# Patient Record
Sex: Female | Born: 1990 | Race: Black or African American | Hispanic: No | Marital: Single | Smoking: Never smoker
Health system: Southern US, Community
[De-identification: ages and names within clinical notes are randomized; demographics above are authoritative.]

## PROBLEM LIST (undated history)

## (undated) DIAGNOSIS — Z9289 Personal history of other medical treatment: Secondary | ICD-10-CM

## (undated) DIAGNOSIS — K589 Irritable bowel syndrome without diarrhea: Secondary | ICD-10-CM

## (undated) DIAGNOSIS — Z23 Encounter for immunization: Secondary | ICD-10-CM

## (undated) DIAGNOSIS — G932 Benign intracranial hypertension: Secondary | ICD-10-CM

## (undated) DIAGNOSIS — J45909 Unspecified asthma, uncomplicated: Secondary | ICD-10-CM

## (undated) HISTORY — DX: Irritable bowel syndrome, unspecified: K58.9

## (undated) HISTORY — DX: Unspecified asthma, uncomplicated: J45.909

## (undated) HISTORY — PX: WISDOM TOOTH EXTRACTION: SHX21

## (undated) HISTORY — DX: Encounter for immunization: Z23

## (undated) HISTORY — DX: Benign intracranial hypertension: G93.2

## (undated) HISTORY — DX: Personal history of other medical treatment: Z92.89

---
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
## 2015-07-22 DIAGNOSIS — Z9289 Personal history of other medical treatment: Secondary | ICD-10-CM

## 2015-07-22 HISTORY — DX: Personal history of other medical treatment: Z92.89

## 2015-07-22 LAB — HM PAP SMEAR: HM PAP: NEGATIVE

## 2016-08-12 ENCOUNTER — Other Ambulatory Visit: Payer: Self-pay | Admitting: Obstetrics and Gynecology

## 2016-08-12 NOTE — Telephone Encounter (Signed)
Pt calling to see why refill of medication was denied.  [phone]

## 2016-08-13 NOTE — Telephone Encounter (Signed)
Pt aware rx sent to [facility].

## 2016-08-13 NOTE — Telephone Encounter (Signed)
Pt is schedule 10/15/16 with Helmut MusterAlicia Copland for annual. Pt requestiing refill

## 2016-10-15 ENCOUNTER — Ambulatory Visit: Payer: Self-pay | Admitting: Obstetrics and Gynecology

## 2016-12-09 ENCOUNTER — Emergency Department (HOSPITAL_COMMUNITY)
Admission: EM | Admit: 2016-12-09 | Discharge: 2016-12-10 | Disposition: A | Discharge status: Home / Self Care | Payer: BLUE CROSS/BLUE SHIELD | Attending: Emergency Medicine | Admitting: Emergency Medicine

## 2016-12-09 ENCOUNTER — Emergency Department (HOSPITAL_COMMUNITY): Payer: BLUE CROSS/BLUE SHIELD

## 2016-12-09 ENCOUNTER — Encounter (HOSPITAL_COMMUNITY): Payer: Self-pay | Admitting: Emergency Medicine

## 2016-12-09 DIAGNOSIS — Z79899 Other long term (current) drug therapy: Secondary | ICD-10-CM | POA: Insufficient documentation

## 2016-12-09 DIAGNOSIS — H538 Other visual disturbances: Secondary | ICD-10-CM | POA: Diagnosis present

## 2016-12-09 DIAGNOSIS — G932 Benign intracranial hypertension: Secondary | ICD-10-CM | POA: Insufficient documentation

## 2016-12-09 DIAGNOSIS — J45909 Unspecified asthma, uncomplicated: Secondary | ICD-10-CM | POA: Diagnosis not present

## 2016-12-09 DIAGNOSIS — R51 Headache: Secondary | ICD-10-CM | POA: Diagnosis not present

## 2016-12-09 LAB — BASIC METABOLIC PANEL
Anion gap: 4 — ABNORMAL LOW (ref 5–15)
BUN: 8 mg/dL (ref 6–20)
CALCIUM: 9 mg/dL (ref 8.9–10.3)
CHLORIDE: 107 mmol/L (ref 101–111)
CO2: 25 mmol/L (ref 22–32)
CREATININE: 0.87 mg/dL (ref 0.44–1.00)
Glucose, Bld: 112 mg/dL — ABNORMAL HIGH (ref 65–99)
Potassium: 4.1 mmol/L (ref 3.5–5.1)
SODIUM: 136 mmol/L (ref 135–145)

## 2016-12-09 LAB — CBC WITH DIFFERENTIAL/PLATELET
Basophils Absolute: 0 10*3/uL (ref 0.0–0.1)
Basophils Relative: 0 %
EOS ABS: 0.3 10*3/uL (ref 0.0–0.7)
EOS PCT: 3 %
HCT: 36.8 % (ref 36.0–46.0)
Hemoglobin: 12.6 g/dL (ref 12.0–15.0)
LYMPHS ABS: 1.5 10*3/uL (ref 0.7–4.0)
Lymphocytes Relative: 18 %
MCH: 24.5 pg — ABNORMAL LOW (ref 26.0–34.0)
MCHC: 34.2 g/dL (ref 30.0–36.0)
MCV: 71.5 fL — AB (ref 78.0–100.0)
MONO ABS: 0.4 10*3/uL (ref 0.1–1.0)
Monocytes Relative: 5 %
Neutro Abs: 6.1 10*3/uL (ref 1.7–7.7)
Neutrophils Relative %: 74 %
PLATELETS: 267 10*3/uL (ref 150–400)
RBC: 5.15 MIL/uL — AB (ref 3.87–5.11)
RDW: 14.5 % (ref 11.5–15.5)
WBC: 8.3 10*3/uL (ref 4.0–10.5)

## 2016-12-09 LAB — I-STAT BETA HCG BLOOD, ED (MC, WL, AP ONLY)

## 2016-12-09 MED ORDER — SODIUM CHLORIDE 0.9 % IV BOLUS (SEPSIS)
1000.0000 mL | Freq: Once | INTRAVENOUS | Status: AC
  Administered 2016-12-10: 1000 mL via INTRAVENOUS

## 2016-12-09 MED ORDER — GADOBENATE DIMEGLUMINE 529 MG/ML IV SOLN
10.0000 mL | Freq: Once | INTRAVENOUS | Status: AC | PRN
  Administered 2016-12-09: 10 mL via INTRAVENOUS

## 2016-12-09 NOTE — ED Provider Notes (Signed)
Patient seen/examined in the Emergency Department in conjunction with Midlevel Provider  Patient reports HA and blurred vision.  Pt with probable pseudotumor Exam : awake/alert, appropriate, ?papilledema noted Plan: pt with negative MRI/MRV,  Pt with probable pseudotumor Plan for Lumbar puncture     Zadie RhineWickline, Davied Nocito, MD 12/09/16 2336

## 2016-12-09 NOTE — ED Notes (Signed)
Called pt to re-assess vitals, pt did not answer.  

## 2016-12-09 NOTE — ED Notes (Signed)
Called pt x2 to re-asses vitals, had no answer.

## 2016-12-09 NOTE — ED Triage Notes (Signed)
Pt reports blurred vision on and off for 1 year since starting birth control as well as a "whooshing" sound in her ears which she saw the ENT for and was told it was normal. Pt saw eye doctor today who said the blood vessels in her eyes were inflamed and sent pt here for further evaluation. Pt a/ox4 resp e/u, nad.

## 2016-12-09 NOTE — ED Notes (Signed)
Dr. Jacqulyn BathLong made aware of pts complaint and recommendation of [facility] Sher[facility]. Dr. Jacqulyn BathLong advised he would place appropriate orders

## 2016-12-10 LAB — GLUCOSE, CSF: GLUCOSE CSF: 59 mg/dL (ref 40–70)

## 2016-12-10 LAB — PROTEIN, CSF: Total  Protein, CSF: 15 mg/dL (ref 15–45)

## 2016-12-10 LAB — CSF CELL COUNT WITH DIFFERENTIAL
RBC Count, CSF: 1 /mm3 — ABNORMAL HIGH
RBC Count, CSF: 1 /mm3 — ABNORMAL HIGH
TUBE #: 4
Tube #: 1
WBC CSF: 1 /mm3 (ref 0–5)
WBC, CSF: 1 /mm3 (ref 0–5)

## 2016-12-10 MED ORDER — METOCLOPRAMIDE HCL 5 MG/ML IJ SOLN
10.0000 mg | Freq: Once | INTRAMUSCULAR | Status: AC
  Administered 2016-12-10: 10 mg via INTRAVENOUS
  Filled 2016-12-10: qty 2

## 2016-12-10 MED ORDER — LIDOCAINE HCL (PF) 1 % IJ SOLN
INTRAMUSCULAR | Status: AC
  Administered 2016-12-10: 30 mL via INTRADERMAL
  Filled 2016-12-10: qty 30

## 2016-12-10 MED ORDER — KETOROLAC TROMETHAMINE 30 MG/ML IJ SOLN
30.0000 mg | Freq: Once | INTRAMUSCULAR | Status: AC
  Administered 2016-12-10: 30 mg via INTRAVENOUS
  Filled 2016-12-10: qty 1

## 2016-12-10 MED ORDER — SODIUM CHLORIDE 0.9 % IV BOLUS (SEPSIS)
1000.0000 mL | Freq: Once | INTRAVENOUS | Status: AC
  Administered 2016-12-10: 1000 mL via INTRAVENOUS

## 2016-12-10 MED ORDER — LIDOCAINE HCL (PF) 1 % IJ SOLN
30.0000 mL | Freq: Once | INTRAMUSCULAR | Status: AC
  Administered 2016-12-10: 30 mL via INTRADERMAL
  Filled 2016-12-10: qty 30

## 2016-12-10 MED ORDER — DIPHENHYDRAMINE HCL 50 MG/ML IJ SOLN
12.5000 mg | Freq: Once | INTRAMUSCULAR | Status: AC
  Administered 2016-12-10: 12.5 mg via INTRAVENOUS
  Filled 2016-12-10: qty 1

## 2016-12-10 NOTE — ED Notes (Signed)
PA at bedside performing lumbar puncture .

## 2016-12-10 NOTE — ED Notes (Signed)
CSF specimen hand delivered to lab by RN .

## 2016-12-10 NOTE — ED Notes (Signed)
PA explained plan of care to pt. and family at bedside .

## 2016-12-10 NOTE — ED Provider Notes (Signed)
[facility]-EMERGENCY DEPT Provider Note   CSN: [id number] Arrival date & time: 12/09/16  1650     History   Chief Complaint Chief Complaint  Patient presents with  . Blurred Vision    HPI Emma Wood is a 26 y.o. female with a hx of asthma, IBS presents to the Emergency Department complaining of intermittent headaches and blurred vision onset 1 year ago.  Pt reports the 2 do not always occur together.  Pt reports vision becomes blurred with position change, worse in the AM. Symptoms last 1-2 minutes and then resolve.  Pt reports she has 2-3 generalized and throbbing headaches per week that resolve spontaneously or with ibuprofen. She denies aura, complex or neurologic symptoms.  No photophobia, phonophobia or vomiting.  No known aggravating factors for her headaches.  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 She was referred here for additional w/u.    Patient denies fever, chills, neck pain, chest pain, shortness of breath, abdominal pain, nausea, vomiting, diarrhea, weakness, dizziness, syncope.   The history is provided by the patient and medical records. No language interpreter was used.    Past Medical History:  Diagnosis Date  . Asthma   . History of Papanicolaou smear of cervix 07/22/2015   neg  . IBS (irritable bowel syndrome)   . Immunization, viral disease    gardasil completed    There are no active problems to display for this patient.   Past Surgical History:  Procedure Laterality Date  . WISDOM TOOTH EXTRACTION      OB History    Gravida Para Term Preterm AB Living   0 0 0 0 0 0   SAB TAB Ectopic Multiple Live Births   0 0 0 0 0       Home Medications    Prior to Admission medications   Medication Sig Start Date End Date Taking? Authorizing Provider  acetaminophen (TYLENOL) 325 MG tablet Take 325-650 mg by mouth every 6 (six) hours as needed (for pain or headaches).    Yes [provider]  ibuprofen (ADVIL,MOTRIN) 200 MG tablet Take 200-400 mg by mouth every 6 (six) hours as needed (for pain or headaches).    Yes [provider]  levocetirizine (XYZAL) 5 MG tablet Take 5 mg by mouth daily.   Yes [provider]  LO LOESTRIN FE 1 MG-10 MCG / 10 MCG tablet TAKE 1 TABLET BY MOUTH EVERY DAY 08/13/16  Yes Copland, Ilona Sorrel, PA-C    Family History Family History  Problem Relation Age of Onset  . Hypertension Mother   . Hypertension Father   . Diabetes Maternal Aunt   . Diabetes Maternal Grandmother     Social History Social History  Substance Use Topics  . Smoking status: Never Smoker  . Smokeless tobacco: Never Used  . Alcohol use No     Allergies   Cherry   Review of Systems Review of Systems  Constitutional: Negative for appetite change, diaphoresis, fatigue, fever and unexpected weight change.  HENT: Negative for mouth sores.   Eyes: Positive for visual disturbance ( intermittent blurred vision).  Respiratory: Negative for cough, chest tightness, shortness of breath and wheezing.   Cardiovascular: Negative for chest pain.  Gastrointestinal: Negative for abdominal pain, constipation, diarrhea, nausea and vomiting.  Endocrine: Negative for polydipsia, polyphagia and polyuria.  Genitourinary: Negative for dysuria, frequency, hematuria and urgency.  Musculoskeletal: Negative for back pain and  neck stiffness.  Skin: Negative for rash.  Allergic/Immunologic: Negative for immunocompromised state.  Neurological: Positive for headaches (intermittent, not present currently). Negative for syncope and light-headedness.  Hematological: Does not bruise/bleed easily.  Psychiatric/Behavioral: Negative for sleep disturbance. The patient is not nervous/anxious.   All other systems reviewed and are negative.    Physical Exam Updated Vital Signs BP (!) 146/90   Pulse (!) 104   Temp 98.5 F (36.9 C) (Oral)   Resp 14   LMP 12/09/2016  (Exact Date)   SpO2 100%   Physical Exam  Constitutional: She is oriented to person, place, and time. She appears well-developed and well-nourished. No distress.  HENT:  Head: Normocephalic and atraumatic.  Mouth/Throat: Oropharynx is clear and moist.  Eyes: Pupils are equal, round, and reactive to light. Conjunctivae and EOM are normal. No scleral icterus.  No horizontal, vertical or rotational nystagmus  Neck: Normal range of motion. Neck supple.  Full active and passive ROM without pain No midline or paraspinal tenderness No nuchal rigidity or meningeal signs  Cardiovascular: Normal rate, regular rhythm and intact distal pulses.   Pulmonary/Chest: Effort normal and breath sounds normal. No respiratory distress. She has no wheezes. She has no rales.  Abdominal: Soft. Bowel sounds are normal. There is no tenderness. There is no rebound and no guarding.  Musculoskeletal: Normal range of motion.  Lymphadenopathy:    She has no cervical adenopathy.  Neurological: She is alert and oriented to person, place, and time. No cranial nerve deficit. She exhibits normal muscle tone. Coordination normal.  Mental Status:  Alert, oriented, thought content appropriate. Speech fluent without evidence of aphasia. Able to follow 2 step commands without difficulty.  Cranial Nerves:  II:  Peripheral visual fields grossly normal, pupils equal, round, reactive to light III,IV, VI: ptosis not present, extra-ocular motions intact bilaterally  V,VII: smile symmetric, facial light touch sensation equal VIII: hearing grossly normal bilaterally  IX,X: midline uvula rise  XI: bilateral shoulder shrug equal and strong XII: midline tongue extension  Motor:  5/5 in upper and lower extremities bilaterally including strong and equal grip strength and dorsiflexion/plantar flexion Sensory: Pinprick and light touch normal in all extremities.  Cerebellar: normal finger-to-nose with bilateral upper extremities Gait:  normal gait and balance CV: distal pulses palpable throughout   Skin: Skin is warm and dry. No rash noted. She is not diaphoretic.  Psychiatric: She has a normal mood and affect. Her behavior is normal. Judgment and thought content normal.  Nursing note and vitals reviewed.    ED Treatments / Results  Labs (all labs ordered are listed, but only abnormal results are displayed) Labs Reviewed  BASIC METABOLIC PANEL - Abnormal; Notable for the following:       Result Value   Glucose, Bld 112 (*)    Anion gap 4 (*)    All other components within normal limits  CBC WITH DIFFERENTIAL/PLATELET - Abnormal; Notable for the following:    RBC 5.15 (*)    MCV 71.5 (*)    MCH 24.5 (*)    All other components within normal limits  CSF CELL COUNT WITH DIFFERENTIAL - Abnormal; Notable for the following:    RBC Count, CSF 1 (*)    All other components within normal limits  CSF CELL COUNT WITH DIFFERENTIAL - Abnormal; Notable for the following:    RBC Count, CSF 1 (*)    All other components within normal limits  CSF CULTURE  GLUCOSE, CSF  PROTEIN, CSF  I-STAT  BETA HCG BLOOD, ED ([facility], [facility], [facility] ONLY)    Radiology Mr Brain Wo Contrast  Result Date: 12/09/2016 CLINICAL DATA:  Initial evaluation for intermittent blurry vision for 1 year, visual loss. EXAM: MRI HEAD WITHOUT CONTRAST MRI ORBITS WITH AND WITHOUT CONTRAST MRV HEAD WITHOUT CONTRAST TECHNIQUE: Multiplanar, multiecho pulse sequences of the brain and surrounding structures were obtained without intravenous contrast. Multiplanar, multiecho pulse sequences of the orbits and surrounding structures were obtained including fat saturation techniques, without and with intravenous contrast administration. MRV images of the brain are also obtained. COMPARISON:  Prior CT from 05/30/2010. FINDINGS: MRI HEAD FINDINGS Brain: Cerebral volume within normal limits for age. No focal parenchymal signal abnormality. No cerebral white matter changes. No abnormal  foci of restricted diffusion to suggest acute or subacute ischemia. Gray-white matter differentiation well maintained. No encephalomalacia to suggest chronic infarction. No evidence for acute or chronic intracranial hemorrhage. No mass lesion, midline shift or mass effect. Ventricles normal in size without evidence for hydrocephalus. No extra-axial fluid collection. Vascular: Major intracranial vascular flow voids are well maintained and normal in appearance. Skull and upper cervical spine: Craniocervical junction normal. Upper cervical spine within normal limits. Bone marrow signal intensity normal. No scalp soft tissue abnormality. Other: Inner ear structures normal.  No mastoid effusion. MRI ORBITS FINDINGS Orbits: Mild enlargement of the optic discs bilaterally, best seen on axial T2 weighted sequence (series 6, image 9). Optic nerves are otherwise unremarkable and are symmetric in size with normal morphology. No abnormal enhancement to suggest inflammation. No abnormality seen at the orbital apices. Optic chiasm normal. Extra-ocular muscles symmetric and normal in appearance bilaterally. Superior orbital veins within normal limits. No abnormality seen at the cavernous sinus. Lacrimal glands normal. Intraconal and extraconal fat well-maintained and normal. Visualized sinuses: Mild mucosal thickening within the ethmoidal air cells. Visualized sinuses are otherwise clear. Soft tissues: Periorbital soft tissues within normal limits. MRV HEAD FINDINGS The superior sagittal sinus is widely patent without abnormality. Torcula patent. Right transverse and sigmoid sinuses are dominant and widely patent without abnormality. Proximal right internal jugular vein is patent. Left transverse and sigmoid sinuses diminutive but patent. Proximal left internal jugular vein patent. Straight sinus, vein of Galen, and internal cerebral veins are patent bilaterally. IMPRESSION: 1. Mild enlargement of the optic discs, with no other  corroborative findings to suggest elevated intracranial pressures within the brain. Finding is of uncertain significance. Correlation with fundoscopic exam recommended. 2. Otherwise normal MRI of the brain and orbits. 3. Normal MRV. Electronically Signed   By: Rise Mu M.D.   On: 12/09/2016 21:28   Mr Mrv Head Wo Cm  Result Date: 12/09/2016 CLINICAL DATA:  Initial evaluation for intermittent blurry vision for 1 year, visual loss. EXAM: MRI HEAD WITHOUT CONTRAST MRI ORBITS WITH AND WITHOUT CONTRAST MRV HEAD WITHOUT CONTRAST TECHNIQUE: Multiplanar, multiecho pulse sequences of the brain and surrounding structures were obtained without intravenous contrast. Multiplanar, multiecho pulse sequences of the orbits and surrounding structures were obtained including fat saturation techniques, without and with intravenous contrast administration. MRV images of the brain are also obtained. COMPARISON:  Prior CT from 05/30/2010. FINDINGS: MRI HEAD FINDINGS Brain: Cerebral volume within normal limits for age. No focal parenchymal signal abnormality. No cerebral white matter changes. No abnormal foci of restricted diffusion to suggest acute or subacute ischemia. Gray-white matter differentiation well maintained. No encephalomalacia to suggest chronic infarction. No evidence for acute or chronic intracranial hemorrhage. No mass lesion, midline shift or mass effect. Ventricles normal in size  without evidence for hydrocephalus. No extra-axial fluid collection. Vascular: Major intracranial vascular flow voids are well maintained and normal in appearance. Skull and upper cervical spine: Craniocervical junction normal. Upper cervical spine within normal limits. Bone marrow signal intensity normal. No scalp soft tissue abnormality. Other: Inner ear structures normal.  No mastoid effusion. MRI ORBITS FINDINGS Orbits: Mild enlargement of the optic discs bilaterally, best seen on axial T2 weighted sequence (series 6, image  9). Optic nerves are otherwise unremarkable and are symmetric in size with normal morphology. No abnormal enhancement to suggest inflammation. No abnormality seen at the orbital apices. Optic chiasm normal. Extra-ocular muscles symmetric and normal in appearance bilaterally. Superior orbital veins within normal limits. No abnormality seen at the cavernous sinus. Lacrimal glands normal. Intraconal and extraconal fat well-maintained and normal. Visualized sinuses: Mild mucosal thickening within the ethmoidal air cells. Visualized sinuses are otherwise clear. Soft tissues: Periorbital soft tissues within normal limits. MRV HEAD FINDINGS The superior sagittal sinus is widely patent without abnormality. Torcula patent. Right transverse and sigmoid sinuses are dominant and widely patent without abnormality. Proximal right internal jugular vein is patent. Left transverse and sigmoid sinuses diminutive but patent. Proximal left internal jugular vein patent. Straight sinus, vein of Galen, and internal cerebral veins are patent bilaterally. IMPRESSION: 1. Mild enlargement of the optic discs, with no other corroborative findings to suggest elevated intracranial pressures within the brain. Finding is of uncertain significance. Correlation with fundoscopic exam recommended. 2. Otherwise normal MRI of the brain and orbits. 3. Normal MRV. Electronically Signed   By: Rise MuBenjamin  McClintock M.D.   On: 12/09/2016 21:28   Mr Rockwell GermanyOrbits W ZOWo Contrast  Result Date: 12/09/2016 CLINICAL DATA:  Initial evaluation for intermittent blurry vision for 1 year, visual loss. EXAM: MRI HEAD WITHOUT CONTRAST MRI ORBITS WITH AND WITHOUT CONTRAST MRV HEAD WITHOUT CONTRAST TECHNIQUE: Multiplanar, multiecho pulse sequences of the brain and surrounding structures were obtained without intravenous contrast. Multiplanar, multiecho pulse sequences of the orbits and surrounding structures were obtained including fat saturation techniques, without and with  intravenous contrast administration. MRV images of the brain are also obtained. COMPARISON:  Prior CT from 05/30/2010. FINDINGS: MRI HEAD FINDINGS Brain: Cerebral volume within normal limits for age. No focal parenchymal signal abnormality. No cerebral white matter changes. No abnormal foci of restricted diffusion to suggest acute or subacute ischemia. Gray-white matter differentiation well maintained. No encephalomalacia to suggest chronic infarction. No evidence for acute or chronic intracranial hemorrhage. No mass lesion, midline shift or mass effect. Ventricles normal in size without evidence for hydrocephalus. No extra-axial fluid collection. Vascular: Major intracranial vascular flow voids are well maintained and normal in appearance. Skull and upper cervical spine: Craniocervical junction normal. Upper cervical spine within normal limits. Bone marrow signal intensity normal. No scalp soft tissue abnormality. Other: Inner ear structures normal.  No mastoid effusion. MRI ORBITS FINDINGS Orbits: Mild enlargement of the optic discs bilaterally, best seen on axial T2 weighted sequence (series 6, image 9). Optic nerves are otherwise unremarkable and are symmetric in size with normal morphology. No abnormal enhancement to suggest inflammation. No abnormality seen at the orbital apices. Optic chiasm normal. Extra-ocular muscles symmetric and normal in appearance bilaterally. Superior orbital veins within normal limits. No abnormality seen at the cavernous sinus. Lacrimal glands normal. Intraconal and extraconal fat well-maintained and normal. Visualized sinuses: Mild mucosal thickening within the ethmoidal air cells. Visualized sinuses are otherwise clear. Soft tissues: Periorbital soft tissues within normal limits. MRV HEAD FINDINGS The superior sagittal sinus  is widely patent without abnormality. Torcula patent. Right transverse and sigmoid sinuses are dominant and widely patent without abnormality. Proximal right  internal jugular vein is patent. Left transverse and sigmoid sinuses diminutive but patent. Proximal left internal jugular vein patent. Straight sinus, vein of Galen, and internal cerebral veins are patent bilaterally. IMPRESSION: 1. Mild enlargement of the optic discs, with no other corroborative findings to suggest elevated intracranial pressures within the brain. Finding is of uncertain significance. Correlation with fundoscopic exam recommended. 2. Otherwise normal MRI of the brain and orbits. 3. Normal MRV. Electronically Signed   By: Rise Mu M.D.   On: 12/09/2016 21:28    Procedures .Lumbar Puncture Date/Time: 12/10/2016 5:01 AM Performed by: Dierdre Forth Authorized by: Dierdre Forth   Consent:    Consent obtained:  Verbal and written   Consent given by:  Patient   Risks discussed:  Bleeding, infection, pain, headache, nerve damage and repeat procedure   Alternatives discussed:  Referral Universal protocol:    Procedure explained and questions answered to patient or proxy's satisfaction: yes     Relevant documents present and verified: yes     Test results available and properly labeled: yes     Imaging studies available: yes     Required blood products, implants, devices, and special equipment available: yes     Immediately prior to procedure a time out was called: yes     Site/side marked: yes     Patient identity confirmed:  Verbally with patient and arm band Pre-procedure details:    Procedure purpose:  Diagnostic   Preparation: Patient was prepped and draped in usual sterile fashion   Anesthesia (see MAR for exact dosages):    Anesthesia method:  Local infiltration   Local anesthetic:  Lidocaine 1% w/o epi (5mL) Procedure details:    Lumbar space:  L3-L4 interspace   Patient position:  Sitting   Needle gauge:  20   Needle type:  Spinal needle - Quincke tip   Needle length (in):  1.5   Ultrasound guidance: no     Number of attempts:  4   Fluid  appearance:  Clear   Tubes of fluid:  4   Total volume (ml):  12 Post-procedure:    Puncture site:  Direct pressure applied and adhesive bandage applied   Patient tolerance of procedure:  Tolerated well, no immediate complications Comments:     Several attempts in the Left lateral position without success.  Pt was then sat up and after several attempts, LP was successful with clear fluid and steady stream.  Unable to obtain opening pressure due to required position change.     (including critical care time)  Medications Ordered in ED Medications  gadobenate dimeglumine (MULTIHANCE) injection 10 mL (10 mLs Intravenous Contrast Given 12/09/16 2041)  sodium chloride 0.9 % bolus 1,000 mL (0 mLs Intravenous Stopped 12/10/16 0131)  lidocaine (PF) (XYLOCAINE) 1 % injection 30 mL (30 mLs Intradermal Given 12/10/16 0108)  ketorolac (TORADOL) 30 MG/ML injection 30 mg (30 mg Intravenous Given 12/10/16 0528)  metoCLOPramide (REGLAN) injection 10 mg (10 mg Intravenous Given 12/10/16 0528)  diphenhydrAMINE (BENADRYL) injection 12.5 mg (12.5 mg Intravenous Given 12/10/16 0528)  sodium chloride 0.9 % bolus 1,000 mL (0 mLs Intravenous Stopped 12/10/16 0648)     Initial Impression / Assessment and Plan / ED Course  I have reviewed the triage vital signs and the nursing notes.  Pertinent labs & imaging results that were available during my care of the  patient were reviewed by me and considered in my medical decision making (see chart for details).     Patient presents with headache and vision changes. MRI/MRV negative for tumor, sinus thrombosis. She does have mild enlargement of the optic disks without other findings to suggest elevated intracranial pressures. Suspect pseudotumor cerebri.  Lumbar puncture was unable to be performed left lateral therefore no opening pressure was obtained however CSF was clear and cell count with differential, glucose and protein were within normal limits. No clinical evidence  for meningitis.  Patient will be discharged home with neurology follow-up and strict return precautions.    Final Clinical Impressions(s) / ED Diagnoses   Final diagnoses:  Pseudotumor cerebri    New Prescriptions Discharge Medication List as of 12/10/2016  7:03 AM       Timoteo Carreiro, Dahlia Client, PA-C 12/10/16 2536    Zadie Rhine, MD 12/10/16 [phone]

## 2016-12-10 NOTE — ED Notes (Addendum)
MD at bedside doing Lumbar Puncture

## 2016-12-13 LAB — CSF CULTURE W GRAM STAIN: Culture: NO GROWTH

## 2016-12-13 LAB — CSF CULTURE

## 2017-01-05 ENCOUNTER — Other Ambulatory Visit: Payer: Self-pay | Admitting: Acute Care

## 2017-01-05 DIAGNOSIS — G932 Benign intracranial hypertension: Secondary | ICD-10-CM

## 2017-01-13 ENCOUNTER — Ambulatory Visit
Admission: RE | Admit: 2017-01-13 | Discharge: 2017-01-13 | Disposition: A | Discharge status: Home / Self Care | Payer: BLUE CROSS/BLUE SHIELD | Source: Ambulatory Visit | Attending: Acute Care | Admitting: Acute Care

## 2017-01-13 DIAGNOSIS — G932 Benign intracranial hypertension: Secondary | ICD-10-CM

## 2017-01-13 LAB — PREGNANCY, URINE: PREG TEST UR: NEGATIVE

## 2017-01-13 MED ORDER — LIDOCAINE HCL (PF) 1 % IJ SOLN
5.0000 mL | Freq: Once | INTRAMUSCULAR | Status: AC
  Administered 2017-01-13: 5 mL
  Filled 2017-01-13: qty 5

## 2017-01-13 MED ORDER — ACETAMINOPHEN 500 MG PO TABS
1000.0000 mg | ORAL_TABLET | Freq: Four times a day (QID) | ORAL | Status: DC | PRN
  Administered 2017-01-13: 1000 mg via ORAL
  Filled 2017-01-13 (×2): qty 2

## 2017-01-13 MED ORDER — ACETAMINOPHEN 500 MG PO TABS
ORAL_TABLET | ORAL | Status: AC
Start: 2017-01-13 — End: 2017-01-13
  Filled 2017-01-13: qty 2

## 2017-01-13 NOTE — Progress Notes (Signed)
Spoke with Dr. Allena KatzPatel,. May discharge patient after 2 hours post LP if patient is non-symptomatic.

## 2019-02-25 LAB — HM PAP SMEAR: HM Pap smear: NEGATIVE

## 2019-03-08 ENCOUNTER — Other Ambulatory Visit: Payer: Self-pay

## 2019-03-08 ENCOUNTER — Inpatient Hospital Stay: Payer: Federal, State, Local not specified - PPO | Attending: Oncology | Admitting: Oncology

## 2019-03-08 ENCOUNTER — Inpatient Hospital Stay: Payer: Federal, State, Local not specified - PPO

## 2019-03-08 ENCOUNTER — Encounter: Payer: Self-pay | Admitting: Oncology

## 2019-03-08 VITALS — BP 113/80 | HR 89 | Temp 97.3°F | Resp 18 | Ht 62.0 in | Wt 155.3 lb

## 2019-03-08 DIAGNOSIS — J45909 Unspecified asthma, uncomplicated: Secondary | ICD-10-CM | POA: Insufficient documentation

## 2019-03-08 DIAGNOSIS — Z79899 Other long term (current) drug therapy: Secondary | ICD-10-CM | POA: Insufficient documentation

## 2019-03-08 DIAGNOSIS — G932 Benign intracranial hypertension: Secondary | ICD-10-CM | POA: Insufficient documentation

## 2019-03-08 DIAGNOSIS — Z832 Family history of diseases of the blood and blood-forming organs and certain disorders involving the immune mechanism: Secondary | ICD-10-CM | POA: Diagnosis not present

## 2019-03-08 DIAGNOSIS — R718 Other abnormality of red blood cells: Secondary | ICD-10-CM | POA: Diagnosis not present

## 2019-03-08 DIAGNOSIS — K589 Irritable bowel syndrome without diarrhea: Secondary | ICD-10-CM | POA: Insufficient documentation

## 2019-03-08 DIAGNOSIS — D582 Other hemoglobinopathies: Secondary | ICD-10-CM | POA: Diagnosis not present

## 2019-03-08 DIAGNOSIS — D509 Iron deficiency anemia, unspecified: Secondary | ICD-10-CM

## 2019-03-08 LAB — CBC WITH DIFFERENTIAL/PLATELET
Abs Immature Granulocytes: 0.05 10*3/uL (ref 0.00–0.07)
Basophils Absolute: 0.1 10*3/uL (ref 0.0–0.1)
Basophils Relative: 1 %
Eosinophils Absolute: 0.2 10*3/uL (ref 0.0–0.5)
Eosinophils Relative: 2 %
HCT: 36.6 % (ref 36.0–46.0)
Hemoglobin: 12 g/dL (ref 12.0–15.0)
Immature Granulocytes: 1 %
Lymphocytes Relative: 21 %
Lymphs Abs: 1.8 10*3/uL (ref 0.7–4.0)
MCH: 24 pg — ABNORMAL LOW (ref 26.0–34.0)
MCHC: 32.8 g/dL (ref 30.0–36.0)
MCV: 73.3 fL — ABNORMAL LOW (ref 80.0–100.0)
Monocytes Absolute: 0.5 10*3/uL (ref 0.1–1.0)
Monocytes Relative: 5 %
Neutro Abs: 6.2 10*3/uL (ref 1.7–7.7)
Neutrophils Relative %: 70 %
Platelets: 271 10*3/uL (ref 150–400)
RBC: 4.99 MIL/uL (ref 3.87–5.11)
RDW: 15.5 % (ref 11.5–15.5)
WBC: 8.9 10*3/uL (ref 4.0–10.5)
nRBC: 0 % (ref 0.0–0.2)

## 2019-03-08 LAB — IRON AND TIBC
Iron: 57 ug/dL (ref 28–170)
Saturation Ratios: 21 % (ref 10.4–31.8)
TIBC: 277 ug/dL (ref 250–450)
UIBC: 220 ug/dL

## 2019-03-08 LAB — FERRITIN: Ferritin: 11 ng/mL (ref 11–307)

## 2019-03-08 NOTE — Progress Notes (Signed)
Patient here for initial visit. Referred by Dr. Leonides Schanz for iron deficiency anemia. Denies having heavy menstrual periods.

## 2019-03-09 DIAGNOSIS — R718 Other abnormality of red blood cells: Secondary | ICD-10-CM | POA: Insufficient documentation

## 2019-03-09 LAB — TECHNOLOGIST SMEAR REVIEW
RBC Morphology: NORMAL
Tech Review: ADEQUATE

## 2019-03-09 NOTE — Progress Notes (Signed)
Hematology/Oncology Consult note [facility] [phone] [phone]   Patient Care Team: System, Pcp Not In as PCP - General  REFERRING PROVIDER: Ward, Honor Loh, MD  CHIEF COMPLAINTS/REASON FOR VISIT:  Evaluation of iron deficiency anmeia  HISTORY OF PRESENTING ILLNESS:   Emma Wood is a  28 y.o.  female with PMH listed below was seen in consultation at the request of  Ward, Honor Loh, MD  for evaluation of iron deficiency anemia.  Patient was seen by Dr.Ward recently   02/16/2019 CBC showed hemoglobin 12.5, MCV 74.2, platelet 219,000. Wbc 8.7. Iron panel showed transferrin 209.8, iron 127, iron saturation 43, ferritin 12.   Reviewed her previous blood work.  11/23/2017 showed MCV 71.6, hemoglobin 12.5  She reports her mother also has " smaller blood cells", mild anemia.  Family history is positive for sickle cell trait.  Denies fatigue, fever chills, unintentional weight loss,  Flow of menses, moderate.   # History of pseudotumor cerebri.    Review of Systems  Constitutional: Negative for appetite change, chills, fatigue and fever.  HENT:   Negative for hearing loss and voice change.   Eyes: Negative for eye problems.  Respiratory: Negative for chest tightness and cough.   Cardiovascular: Negative for chest pain.  Gastrointestinal: Negative for abdominal distention, abdominal pain and blood in stool.  Endocrine: Negative for hot flashes.  Genitourinary: Negative for difficulty urinating and frequency.   Musculoskeletal: Negative for arthralgias.  Skin: Negative for itching and rash.  Neurological: Negative for extremity weakness.  Hematological: Negative for adenopathy.  Psychiatric/Behavioral: Negative for confusion.    MEDICAL HISTORY:  Past Medical History:  Diagnosis Date  . Asthma   . History of Papanicolaou smear of cervix 07/22/2015   neg  . IBS (irritable bowel syndrome)   . Immunization, viral disease    gardasil completed  . Pseudotumor     SURGICAL HISTORY: Past Surgical History:  Procedure Laterality Date  . WISDOM TOOTH EXTRACTION      SOCIAL HISTORY: Social History   Socioeconomic History  . Marital status: Single    Spouse name: Not on file  . Number of children: 0  . Years of education: 93  . Highest education level: Not on file  Occupational History  . Occupation: customer service    Comment: [place]  Tobacco Use  . Smoking status: Never Smoker  . Smokeless tobacco: Never Used  Substance and Sexual Activity  . Alcohol use: No  . Drug use: No  . Sexual activity: Yes    Birth control/protection: Condom  Other Topics Concern  . Not on file  Social History Narrative  . Not on file   Social Determinants of Health   Financial Resource Strain:   . Difficulty of Paying Living Expenses: Not on file  Food Insecurity:   . Worried About Charity fundraiser in the Last Year: Not on file  . Ran Out of Food in the Last Year: Not on file  Transportation Needs:   . Lack of Transportation (Medical): Not on file  . Lack of Transportation (Non-Medical): Not on file  Physical Activity:   . Days of Exercise per Week: Not on file  . Minutes of Exercise per Session: Not on file  Stress:   . Feeling of Stress : Not on file  Social Connections:   . Frequency of Communication with Friends and Family: Not on file  . Frequency of Social Gatherings with Friends and Family: Not on file  .  Attends Religious Services: Not on file  . Active Member of Clubs or Organizations: Not on file  . Attends BankerClub or Organization Meetings: Not on file  . Marital Status: Not on file  Intimate Partner Violence:   . Fear of Current or Ex-Partner: Not on file  . Emotionally Abused: Not on file  . Physically Abused: Not on file  . Sexually Abused: Not on file    FAMILY HISTORY: Family History  Problem Relation Age of Onset  . Hypertension Mother   . Hypertension Father   . Diabetes Maternal  Aunt   . Diabetes Maternal Grandmother     ALLERGIES:  is allergic to cherry.  MEDICATIONS:  Current Outpatient Medications  Medication Sig Dispense Refill  . acetaminophen (TYLENOL) 325 MG tablet Take 325-650 mg by mouth every 6 (six) hours as needed (for pain or headaches).     Marland Kitchen. acetaZOLAMIDE (DIAMOX) 500 MG capsule Take by mouth.    Marland Kitchen. ibuprofen (ADVIL,MOTRIN) 200 MG tablet Take 200-400 mg by mouth every 6 (six) hours as needed (for pain or headaches).     Marland Kitchen. levocetirizine (XYZAL) 5 MG tablet Take 5 mg by mouth daily.    . Vitamin D, Ergocalciferol, (DRISDOL) 1.25 MG (50000 UT) CAPS capsule Take by mouth.    . LO LOESTRIN FE 1 MG-10 MCG / 10 MCG tablet TAKE 1 TABLET BY MOUTH EVERY DAY (Patient not taking: Reported on 03/08/2019) 84 tablet 0   No current facility-administered medications for this visit.     PHYSICAL EXAMINATION: ECOG PERFORMANCE STATUS: 0 - Asymptomatic Vitals:   03/08/19 1516  BP: 113/80  Pulse: 89  Resp: 18  Temp: (!) 97.3 F (36.3 C)   Filed Weights   03/08/19 1516  Weight: 155 lb 4.8 oz (70.4 kg)    Physical Exam Constitutional:      General: She is not in acute distress. HENT:     Head: Normocephalic and atraumatic.  Eyes:     General: No scleral icterus.    Pupils: Pupils are equal, round, and reactive to light.  Cardiovascular:     Rate and Rhythm: Normal rate and regular rhythm.     Heart sounds: Normal heart sounds.  Pulmonary:     Effort: Pulmonary effort is normal. No respiratory distress.     Breath sounds: No wheezing.  Abdominal:     General: Bowel sounds are normal. There is no distension.     Palpations: Abdomen is soft. There is no mass.     Tenderness: There is no abdominal tenderness.  Musculoskeletal:        General: No deformity. Normal range of motion.     Cervical back: Normal range of motion and neck supple.  Skin:    General: Skin is warm and dry.     Findings: No erythema or rash.  Neurological:     Mental Status:  She is alert and oriented to person, place, and time.     Cranial Nerves: No cranial nerve deficit.     Coordination: Coordination normal.  Psychiatric:        Behavior: Behavior normal.        Thought Content: Thought content normal.     LABORATORY DATA:  I have reviewed the data as listed Lab Results  Component Value Date   WBC 8.9 03/08/2019   HGB 12.0 03/08/2019   HCT 36.6 03/08/2019   MCV 73.3 (L) 03/08/2019   PLT 271 03/08/2019   No results for input(s): NA, K,  CL, CO2, GLUCOSE, BUN, CREATININE, CALCIUM, GFRNONAA, GFRAA, PROT, ALBUMIN, AST, ALT, ALKPHOS, BILITOT, BILIDIR, IBILI in the last 8760 hours. Iron/TIBC/Ferritin/ %Sat    Component Value Date/Time   IRON 57 03/08/2019 1549   TIBC 277 03/08/2019 1549   FERRITIN 11 03/08/2019 1549   IRONPCTSAT 21 03/08/2019 1549      RADIOGRAPHIC STUDIES: I have personally reviewed the radiological images as listed and agreed with the findings in the report. No results found.    ASSESSMENT & PLAN:  1. Microcytosis   2. Family history of sickle cell trait   3. Pseudotumor cerebri    Labs are reviewed and discussed with patient.  She has persistent microcytosis without anemia.  Recent iron panel showed a mixed picture. Low normal end ferritin 12, normal iron saturation 43%. TIBC is at low normal end.  Suspect underlying hemoglobinopathy.Family history of sickle cell trait.  Will send work up . Repeat iron panel, check cbc, smear.    pseudotumor cerebri. On Diamox. Follow up with neurology Orders Placed This Encounter  Procedures  . Hemoglobinopathy evaluation    Standing Status:   Future    Number of Occurrences:   1    Standing Expiration Date:   03/07/2020  . CBC with Differential    Standing Status:   Future    Number of Occurrences:   1    Standing Expiration Date:   03/07/2020  . Ferritin    Standing Status:   Future    Number of Occurrences:   1    Standing Expiration Date:   03/07/2020  . Iron and TIBC     Standing Status:   Future    Number of Occurrences:   1    Standing Expiration Date:   03/07/2020  . Technologist smear review    Standing Status:   Future    Number of Occurrences:   1    Standing Expiration Date:   03/07/2020    All questions were answered. The patient knows to call the clinic with any problems questions or concerns.  cc Ward, Elenora Fender, MD   Return of visit: 2 weeks virtual visit to discuss blood work.  Thank you for this kind referral and the opportunity to participate in the care of this patient. A copy of today's note is routed to referring provider    Rickard Patience, MD, PhD Hematology Oncology [facility] Pager- [phone] 03/09/2019

## 2019-03-10 LAB — HEMOGLOBINOPATHY EVALUATION
Hgb A2 Quant: 3.1 % (ref 1.8–3.2)
Hgb A: 65.2 % — ABNORMAL LOW (ref 96.4–98.8)
Hgb C: 31.7 % — ABNORMAL HIGH
Hgb F Quant: 0 % (ref 0.0–2.0)
Hgb S Quant: 0 %
Hgb Variant: 0 %

## 2019-03-22 ENCOUNTER — Inpatient Hospital Stay (HOSPITAL_BASED_OUTPATIENT_CLINIC_OR_DEPARTMENT_OTHER): Payer: Federal, State, Local not specified - PPO | Admitting: Oncology

## 2019-03-22 ENCOUNTER — Other Ambulatory Visit: Payer: Self-pay

## 2019-03-22 ENCOUNTER — Encounter: Payer: Self-pay | Admitting: Oncology

## 2019-03-22 DIAGNOSIS — D582 Other hemoglobinopathies: Secondary | ICD-10-CM

## 2019-03-22 DIAGNOSIS — R718 Other abnormality of red blood cells: Secondary | ICD-10-CM

## 2019-03-22 NOTE — Progress Notes (Signed)
HEMATOLOGY-ONCOLOGY TeleHEALTH VISIT PROGRESS NOTE  I connected with Emma Wood on 03/22/19 at  2:30 PM EST by video enabled telemedicine visit and verified that I am speaking with the correct person using two identifiers. I discussed the limitations, risks, security and privacy concerns of performing an evaluation and management service by telemedicine and the availability of in-person appointments. I also discussed with the patient that there may be a patient responsible charge related to this service. The patient expressed understanding and agreed to proceed.   Other persons participating in the visit and their role in the encounter:  None  Patient's location: Home  Provider's location: office Chief Complaint: Microcytosis   INTERVAL HISTORY Emma Wood is a 28 y.o. female who has above history reviewed by me today presents for follow up visit for management of microcytosis Problems and complaints are listed below:  Patient had blood work done during interval.  Present virtually to discuss lab results.  She has no new complaints. Review of Systems  Constitutional: Negative for appetite change, chills, fatigue and fever.  HENT:   Negative for hearing loss and voice change.   Eyes: Negative for eye problems.  Respiratory: Negative for chest tightness and cough.   Cardiovascular: Negative for chest pain.  Gastrointestinal: Negative for abdominal distention, abdominal pain and blood in stool.  Endocrine: Negative for hot flashes.  Genitourinary: Negative for difficulty urinating and frequency.   Musculoskeletal: Negative for arthralgias.  Skin: Negative for itching and rash.  Neurological: Negative for extremity weakness.  Hematological: Negative for adenopathy.  Psychiatric/Behavioral: Negative for confusion.    Past Medical History:  Diagnosis Date  . Asthma   . History of Papanicolaou smear of cervix 07/22/2015   neg  . IBS (irritable bowel syndrome)   . Immunization,  viral disease    gardasil completed  . Pseudotumor    Past Surgical History:  Procedure Laterality Date  . WISDOM TOOTH EXTRACTION      Family History  Problem Relation Age of Onset  . Hypertension Mother   . Hypertension Father   . Diabetes Maternal Aunt   . Diabetes Maternal Grandmother     Social History   Socioeconomic History  . Marital status: Single    Spouse name: Not on file  . Number of children: 0  . Years of education: 11  . Highest education level: Not on file  Occupational History  . Occupation: customer service    Comment: [place]  Tobacco Use  . Smoking status: Never Smoker  . Smokeless tobacco: Never Used  Substance and Sexual Activity  . Alcohol use: No  . Drug use: No  . Sexual activity: Yes    Birth control/protection: Condom  Other Topics Concern  . Not on file  Social History Narrative  . Not on file   Social Determinants of Health   Financial Resource Strain:   . Difficulty of Paying Living Expenses: Not on file  Food Insecurity:   . Worried About Programme researcher, broadcasting/film/video in the Last Year: Not on file  . Ran Out of Food in the Last Year: Not on file  Transportation Needs:   . Lack of Transportation (Medical): Not on file  . Lack of Transportation (Non-Medical): Not on file  Physical Activity:   . Days of Exercise per Week: Not on file  . Minutes of Exercise per Session: Not on file  Stress:   . Feeling of Stress : Not on file  Social Connections:   . Frequency  of Communication with Friends and Family: Not on file  . Frequency of Social Gatherings with Friends and Family: Not on file  . Attends Religious Services: Not on file  . Active Member of Clubs or Organizations: Not on file  . Attends Archivist Meetings: Not on file  . Marital Status: Not on file  Intimate Partner Violence:   . Fear of Current or Ex-Partner: Not on file  . Emotionally Abused: Not on file  . Physically Abused: Not on file  . Sexually Abused: Not on  file    Current Outpatient Medications on File Prior to Visit  Medication Sig Dispense Refill  . acetaminophen (TYLENOL) 325 MG tablet Take 325-650 mg by mouth every 6 (six) hours as needed (for pain or headaches).     Marland Kitchen acetaZOLAMIDE (DIAMOX) 500 MG capsule Take by mouth.    Marland Kitchen ibuprofen (ADVIL,MOTRIN) 200 MG tablet Take 200-400 mg by mouth every 6 (six) hours as needed (for pain or headaches).     Marland Kitchen levocetirizine (XYZAL) 5 MG tablet Take 5 mg by mouth daily.    . Vitamin D, Ergocalciferol, (DRISDOL) 1.25 MG (50000 UT) CAPS capsule Take by mouth.    . LO LOESTRIN FE 1 MG-10 MCG / 10 MCG tablet TAKE 1 TABLET BY MOUTH EVERY DAY (Patient not taking: Reported on 03/08/2019) 84 tablet 0   No current facility-administered medications on file prior to visit.    Allergies  Allergen Reactions  . Cherry Rash    " bumps appear on my lips"       Observations/Objective: There were no vitals filed for this visit. There is no height or weight on file to calculate BMI.  Physical Exam  Constitutional: No distress.  Neurological: She is alert.  Psychiatric: Mood normal.    CBC    Component Value Date/Time   WBC 8.9 03/08/2019 1549   RBC 4.99 03/08/2019 1549   HGB 12.0 03/08/2019 1549   HCT 36.6 03/08/2019 1549   PLT 271 03/08/2019 1549   MCV 73.3 (L) 03/08/2019 1549   MCH 24.0 (L) 03/08/2019 1549   MCHC 32.8 03/08/2019 1549   RDW 15.5 03/08/2019 1549   LYMPHSABS 1.8 03/08/2019 1549   MONOABS 0.5 03/08/2019 1549   EOSABS 0.2 03/08/2019 1549   BASOSABS 0.1 03/08/2019 1549    CMP     Component Value Date/Time   NA 136 12/09/2016 1728   K 4.1 12/09/2016 1728   CL 107 12/09/2016 1728   CO2 25 12/09/2016 1728   GLUCOSE 112 (H) 12/09/2016 1728   BUN 8 12/09/2016 1728   CREATININE 0.87 12/09/2016 1728   CALCIUM 9.0 12/09/2016 1728   GFRNONAA >60 12/09/2016 1728   GFRAA >60 12/09/2016 1728     Assessment and Plan: 1. 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 Recommend genetic counseling with pattern if patient plans to conceive in the future. Microcytosis is most likely secondary to hemoglobin C trait. Her iron panel showed ferritin of 11 which is borderline.  TIBC 2077, saturation 21. Advised patient to take it daily with you oral iron supplementation ferrous sulfate 325 mg for 3 months.  Follow Up Instructions: 6 months   I discussed the assessment and treatment plan with the patient. The patient was provided an opportunity to ask questions and all were answered. The patient agreed with the plan and demonstrated  an understanding of the instructions.  The patient was advised to call back or seek an in-person evaluation if the symptoms worsen or if the condition fails to improve as anticipated.     Rickard PatienceZhou Amyrah Pinkhasov, MD 03/22/2019 10:05 PM

## 2019-03-22 NOTE — Progress Notes (Signed)
Patient contacted for telehealth visit. No concers voiced.  

## 2019-06-10 ENCOUNTER — Ambulatory Visit: Payer: Federal, State, Local not specified - PPO

## 2019-07-19 DIAGNOSIS — H4710 Unspecified papilledema: Secondary | ICD-10-CM | POA: Diagnosis not present

## 2019-07-31 DIAGNOSIS — G932 Benign intracranial hypertension: Secondary | ICD-10-CM | POA: Diagnosis not present

## 2019-09-13 DIAGNOSIS — H4710 Unspecified papilledema: Secondary | ICD-10-CM | POA: Diagnosis not present

## 2019-09-18 ENCOUNTER — Inpatient Hospital Stay: Payer: Federal, State, Local not specified - PPO | Attending: Oncology

## 2019-09-18 ENCOUNTER — Other Ambulatory Visit: Payer: Self-pay

## 2019-09-18 DIAGNOSIS — D509 Iron deficiency anemia, unspecified: Secondary | ICD-10-CM | POA: Insufficient documentation

## 2019-09-18 DIAGNOSIS — D582 Other hemoglobinopathies: Secondary | ICD-10-CM

## 2019-09-18 DIAGNOSIS — R718 Other abnormality of red blood cells: Secondary | ICD-10-CM

## 2019-09-18 LAB — CBC WITH DIFFERENTIAL/PLATELET
Abs Immature Granulocytes: 0.07 10*3/uL (ref 0.00–0.07)
Basophils Absolute: 0.1 10*3/uL (ref 0.0–0.1)
Basophils Relative: 1 %
Eosinophils Absolute: 0.1 10*3/uL (ref 0.0–0.5)
Eosinophils Relative: 2 %
HCT: 34.6 % — ABNORMAL LOW (ref 36.0–46.0)
Hemoglobin: 11.7 g/dL — ABNORMAL LOW (ref 12.0–15.0)
Immature Granulocytes: 1 %
Lymphocytes Relative: 21 %
Lymphs Abs: 1.5 10*3/uL (ref 0.7–4.0)
MCH: 24 pg — ABNORMAL LOW (ref 26.0–34.0)
MCHC: 33.8 g/dL (ref 30.0–36.0)
MCV: 70.9 fL — ABNORMAL LOW (ref 80.0–100.0)
Monocytes Absolute: 0.4 10*3/uL (ref 0.1–1.0)
Monocytes Relative: 6 %
Neutro Abs: 4.9 10*3/uL (ref 1.7–7.7)
Neutrophils Relative %: 69 %
Platelets: 245 10*3/uL (ref 150–400)
RBC: 4.88 MIL/uL (ref 3.87–5.11)
RDW: 15.4 % (ref 11.5–15.5)
WBC: 7 10*3/uL (ref 4.0–10.5)
nRBC: 0 % (ref 0.0–0.2)

## 2019-09-18 LAB — FERRITIN: Ferritin: 14 ng/mL (ref 11–307)

## 2019-09-18 LAB — IRON AND TIBC
Iron: 103 ug/dL (ref 28–170)
Saturation Ratios: 38 % — ABNORMAL HIGH (ref 10.4–31.8)
TIBC: 269 ug/dL (ref 250–450)
UIBC: 166 ug/dL

## 2019-09-18 LAB — RETIC PANEL
Immature Retic Fract: 15.5 % (ref 2.3–15.9)
RBC.: 4.83 MIL/uL (ref 3.87–5.11)
Retic Count, Absolute: 69.6 10*3/uL (ref 19.0–186.0)
Retic Ct Pct: 1.4 % (ref 0.4–3.1)
Reticulocyte Hemoglobin: 28.5 pg (ref >27.9)

## 2019-09-19 ENCOUNTER — Encounter: Payer: Self-pay | Admitting: Oncology

## 2019-09-19 ENCOUNTER — Inpatient Hospital Stay (HOSPITAL_BASED_OUTPATIENT_CLINIC_OR_DEPARTMENT_OTHER): Payer: Federal, State, Local not specified - PPO | Admitting: Oncology

## 2019-09-19 DIAGNOSIS — D509 Iron deficiency anemia, unspecified: Secondary | ICD-10-CM

## 2019-09-19 DIAGNOSIS — D582 Other hemoglobinopathies: Secondary | ICD-10-CM | POA: Diagnosis not present

## 2019-09-19 NOTE — Progress Notes (Signed)
HEMATOLOGY-ONCOLOGY TeleHEALTH VISIT PROGRESS NOTE  I connected with Emma Wood on 09/19/19 at  2:30 PM EDT by video enabled telemedicine visit and verified that I am speaking with the correct person using two identifiers. I discussed the limitations, risks, security and privacy concerns of performing an evaluation and management service by telemedicine and the availability of in-person appointments. I also discussed with the patient that there may be a patient responsible charge related to this service. The patient expressed understanding and agreed to proceed.   Other persons participating in the visit and their role in the encounter:  None  Patient's location: Home  Provider's location: office Chief Complaint: Microcytosis, anemia, hemoglobin C trait   INTERVAL HISTORY Emma Wood is a 29 y.o. female who has above history reviewed by me today presents for follow up visit for management of microcytosis Problems and complaints are listed below:  Patient denies any new complaints today. LMP 1 week ago. Review of Systems  Constitutional: Negative for appetite change, chills, fatigue and fever.  HENT:   Negative for hearing loss and voice change.   Eyes: Negative for eye problems.  Respiratory: Negative for chest tightness and cough.   Cardiovascular: Negative for chest pain.  Gastrointestinal: Negative for abdominal distention, abdominal pain and blood in stool.  Endocrine: Negative for hot flashes.  Genitourinary: Negative for difficulty urinating and frequency.   Musculoskeletal: Negative for arthralgias.  Skin: Negative for itching and rash.  Neurological: Negative for extremity weakness.  Hematological: Negative for adenopathy.  Psychiatric/Behavioral: Negative for confusion.    Past Medical History:  Diagnosis Date  . Asthma   . History of Papanicolaou smear of cervix 07/22/2015   neg  . IBS (irritable bowel syndrome)   . Immunization, viral disease    gardasil  completed  . Pseudotumor    Past Surgical History:  Procedure Laterality Date  . WISDOM TOOTH EXTRACTION      Family History  Problem Relation Age of Onset  . Hypertension Mother   . Hypertension Father   . Diabetes Maternal Aunt   . Diabetes Maternal Grandmother     Social History   Socioeconomic History  . Marital status: Single    Spouse name: Not on file  . Number of children: 0  . Years of education: 1  . Highest education level: Not on file  Occupational History  . Occupation: customer service    Comment: [place]  Tobacco Use  . Smoking status: Never Smoker  . Smokeless tobacco: Never Used  Vaping Use  . Vaping Use: Never used  Substance and Sexual Activity  . Alcohol use: No  . Drug use: No  . Sexual activity: Yes    Birth control/protection: Condom  Other Topics Concern  . Not on file  Social History Narrative  . Not on file   Social Determinants of Health   Financial Resource Strain:   . Difficulty of Paying Living Expenses:   Food Insecurity:   . Worried About Programme researcher, broadcasting/film/video in the Last Year:   . Barista in the Last Year:   Transportation Needs:   . Freight forwarder (Medical):   Marland Kitchen Lack of Transportation (Non-Medical):   Physical Activity:   . Days of Exercise per Week:   . Minutes of Exercise per Session:   Stress:   . Feeling of Stress :   Social Connections:   . Frequency of Communication with Friends and Family:   . Frequency of Social Gatherings  with Friends and Family:   . Attends Religious Services:   . Active Member of Clubs or Organizations:   . Attends Banker Meetings:   Marland Kitchen Marital Status:   Intimate Partner Violence:   . Fear of Current or Ex-Partner:   . Emotionally Abused:   Marland Kitchen Physically Abused:   . Sexually Abused:     Current Outpatient Medications on File Prior to Visit  Medication Sig Dispense Refill  . acetaminophen (TYLENOL) 325 MG tablet Take 325-650 mg by mouth every 6 (six) hours as  needed (for pain or headaches).     Marland Kitchen acetaZOLAMIDE (DIAMOX) 500 MG capsule Take by mouth.    Marland Kitchen ibuprofen (ADVIL,MOTRIN) 200 MG tablet Take 200-400 mg by mouth every 6 (six) hours as needed (for pain or headaches).     Marland Kitchen levocetirizine (XYZAL) 5 MG tablet Take 5 mg by mouth daily.    . Vitamin D, Ergocalciferol, (DRISDOL) 1.25 MG (50000 UT) CAPS capsule Take by mouth.    . LO LOESTRIN FE 1 MG-10 MCG / 10 MCG tablet TAKE 1 TABLET BY MOUTH EVERY DAY (Patient not taking: Reported on 03/08/2019) 84 tablet 0   No current facility-administered medications on file prior to visit.    Allergies  Allergen Reactions  . Cherry Rash    " bumps appear on my lips"       Observations/Objective: Today's Vitals   09/19/19 1414  PainSc: 0-No pain   There is no height or weight on file to calculate BMI.  Physical Exam Constitutional:      General: She is not in acute distress. Neurological:     Mental Status: She is alert.  Psychiatric:        Mood and Affect: Mood normal.     CBC    Component Value Date/Time   WBC 7.0 09/18/2019 1324   RBC 4.83 09/18/2019 1324   RBC 4.88 09/18/2019 1324   HGB 11.7 (L) 09/18/2019 1324   HCT 34.6 (L) 09/18/2019 1324   PLT 245 09/18/2019 1324   MCV 70.9 (L) 09/18/2019 1324   MCH 24.0 (L) 09/18/2019 1324   MCHC 33.8 09/18/2019 1324   RDW 15.4 09/18/2019 1324   LYMPHSABS 1.5 09/18/2019 1324   MONOABS 0.4 09/18/2019 1324   EOSABS 0.1 09/18/2019 1324   BASOSABS 0.1 09/18/2019 1324    CMP     Component Value Date/Time   NA 136 12/09/2016 1728   K 4.1 12/09/2016 1728   CL 107 12/09/2016 1728   CO2 25 12/09/2016 1728   GLUCOSE 112 (H) 12/09/2016 1728   BUN 8 12/09/2016 1728   CREATININE 0.87 12/09/2016 1728   CALCIUM 9.0 12/09/2016 1728   GFRNONAA >60 12/09/2016 1728   GFRAA >60 12/09/2016 1728     Assessment and Plan: 1. 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  Likely secondary to  most recent measure. Iron panel was reviewed and discussed with patient. Ferritin has improved to 14, iron saturation increased at 38.  Patient has been on oral iron supplementation for about 3 months and advised patient to stop. Recommend multivitamin.  Patient has hemoglobin C trait, no intervention needed.  Follow Up Instructions: 1 year   I discussed the assessment and treatment plan with the patient. The patient was provided an opportunity to ask questions and all were answered. The patient agreed with the plan and demonstrated an understanding of the instructions.  The patient was advised to call back or seek an in-person evaluation if the symptoms worsen or if the condition fails to improve as anticipated.     Earlie Server, MD 09/19/2019 11:35 PM

## 2019-09-19 NOTE — Progress Notes (Signed)
Patient verified using two identifiers for virtual visit via telephone today.  Patient denies new problems/concerns today.   

## 2019-10-31 DIAGNOSIS — G932 Benign intracranial hypertension: Secondary | ICD-10-CM | POA: Diagnosis not present

## 2020-04-11 DIAGNOSIS — G932 Benign intracranial hypertension: Secondary | ICD-10-CM | POA: Diagnosis not present

## 2020-04-16 DIAGNOSIS — D509 Iron deficiency anemia, unspecified: Secondary | ICD-10-CM | POA: Diagnosis not present

## 2020-04-16 DIAGNOSIS — R5383 Other fatigue: Secondary | ICD-10-CM | POA: Diagnosis not present

## 2020-04-16 DIAGNOSIS — Z01419 Encounter for gynecological examination (general) (routine) without abnormal findings: Secondary | ICD-10-CM | POA: Diagnosis not present

## 2020-04-16 DIAGNOSIS — Z13220 Encounter for screening for lipoid disorders: Secondary | ICD-10-CM | POA: Diagnosis not present

## 2020-04-16 DIAGNOSIS — E559 Vitamin D deficiency, unspecified: Secondary | ICD-10-CM | POA: Diagnosis not present

## 2020-04-16 DIAGNOSIS — Z0000 Encounter for general adult medical examination without abnormal findings: Secondary | ICD-10-CM | POA: Diagnosis not present

## 2020-04-30 NOTE — Progress Notes (Signed)
New patient visit   Patient: Emma Wood   DOB: [birth year]   30 y.o. Female  MRN: [id number] Visit Date: 05/01/2020  Today's healthcare provider: Trey Sailors, PA-C   Chief Complaint  Patient presents with  . New Patient (Initial Visit)   Subjective    Emma Wood is a 30 y.o. female who presents today as a new patient to establish care.  Abdominal Pain This is a new problem. The current episode started more than 1 year ago. The problem occurs daily. The problem has been unchanged. The pain is located in the LLQ. The pain is at a severity of 4/10. The pain is moderate. The quality of the pain is dull and a sensation of fullness. The abdominal pain radiates to the RLQ. Associated symptoms include constipation. Pertinent negatives include no belching, diarrhea, dysuria, frequency, nausea or vomiting. The pain is relieved by bowel movements. She has tried nothing for the symptoms. The treatment provided no relief.    Lives in [place] and works with [employer] at [employer]. Lives with dad and step mom. Attended school for criminal justice.   Reports she has a history of constipation after taking multivitamin. She reports some abdominal pain somewhat consistently throughout her life. She reports significant constipation last year that has improved with fiber supplements. No unintentional weight loss or fevers. Currently has bowel movement almost every day. Normal periods. No vaginal discharge. No irregular vaginal bleeding.   Last PAP with Gavin Potters 01/2019 and was normal.     Past Medical History:  Diagnosis Date  . Asthma   . History of Papanicolaou smear of cervix 07/22/2015   neg  . IBS (irritable bowel syndrome)   . Immunization, viral disease    gardasil completed  . Pseudotumor    Past Surgical History:  Procedure Laterality Date  . WISDOM TOOTH EXTRACTION     Family Status  Relation Name Status  . Mother  Alive  . Father  Alive  . [employer]  . MGM   Deceased   Family History  Problem Relation Age of Onset  . Hypertension Mother   . Hypertension Father   . Diabetes Maternal Aunt   . Diabetes Maternal Grandmother    Social History   Socioeconomic History  . Marital status: Single    Spouse name: Not on file  . Number of children: 0  . Years of education: 82  . Highest education level: Not on file  Occupational History  . Occupation: customer service    Comment: airport  Tobacco Use  . Smoking status: Never Smoker  . Smokeless tobacco: Never Used  Vaping Use  . Vaping Use: Never used  Substance and Sexual Activity  . Alcohol use: No  . Drug use: No  . Sexual activity: Yes    Birth control/protection: Condom  Other Topics Concern  . Not on file  Social History Narrative  . Not on file   Social Determinants of Health   Financial Resource Strain: Not on file  Food Insecurity: Not on file  Transportation Needs: Not on file  Physical Activity: Not on file  Stress: Not on file  Social Connections: Not on file   Outpatient Medications Prior to Visit  Medication Sig  . acetaminophen (TYLENOL) 325 MG tablet Take 325-650 mg by mouth every 6 (six) hours as needed (for pain or headaches).   Marland Kitchen acetaZOLAMIDE (DIAMOX) 500 MG capsule Take by mouth.  Marland Kitchen ibuprofen (ADVIL,MOTRIN) 200 MG tablet  Take 200-400 mg by mouth every 6 (six) hours as needed (for pain or headaches).   Marland Kitchen levocetirizine (XYZAL) 5 MG tablet Take 5 mg by mouth daily.  . Vitamin D, Ergocalciferol, (DRISDOL) 1.25 MG (50000 UT) CAPS capsule Take by mouth.  . [DISCONTINUED] LO LOESTRIN FE 1 MG-10 MCG / 10 MCG tablet TAKE 1 TABLET BY MOUTH EVERY DAY (Patient not taking: Reported on 03/08/2019)   No facility-administered medications prior to visit.   Allergies  Allergen Reactions  . Cherry Rash    " bumps appear on my lips"    Immunization History  Administered Date(s) Administered  . Tdap 05/01/2020    Health Maintenance  Topic Date Due  . Hepatitis C  Screening  Never done  . COVID-19 Vaccine (1) Never done  . HIV Screening  Never done  . INFLUENZA VACCINE  06/27/2020 (Originally 10/29/2019)  . PAP-Cervical Cytology Screening  02/24/2022  . PAP SMEAR-Modifier  02/24/2022  . TETANUS/TDAP  05/01/2030    Patient Care Team: Maryella Shivers as PCP - General (Physician Assistant)  Review of Systems  Constitutional: Negative.   HENT: Negative.   Eyes: Negative.   Respiratory: Negative.   Cardiovascular: Negative.   Gastrointestinal: Positive for abdominal pain and constipation. Negative for diarrhea, nausea and vomiting.  Endocrine: Negative.   Genitourinary: Negative.  Negative for dysuria and frequency.  Musculoskeletal: Negative.   Skin: Negative.   Allergic/Immunologic: Negative.   Neurological: Negative.   Hematological: Negative.   Psychiatric/Behavioral: Negative.       Objective    BP 120/88 (BP Location: Left Arm, Patient Position: Sitting, Cuff Size: Large)   Pulse 86   Temp 98.9 F (37.2 C) (Oral)   Ht 5\' 3"  (1.6 m)   Wt 154 lb 8 oz (70.1 kg)   LMP 04/19/2020 (Exact Date)   SpO2 100%   BMI 27.37 kg/m  Physical Exam Constitutional:      Appearance: Normal appearance.  HENT:     Right Ear: Tympanic membrane, ear canal and external ear normal.     Left Ear: Tympanic membrane, ear canal and external ear normal.  Cardiovascular:     Rate and Rhythm: Normal rate and regular rhythm.     Pulses: Normal pulses.     Heart sounds: Normal heart sounds.  Pulmonary:     Effort: Pulmonary effort is normal.     Breath sounds: Normal breath sounds.  Abdominal:     General: Abdomen is flat. Bowel sounds are normal.     Palpations: Abdomen is soft. There is no mass.     Tenderness: There is no abdominal tenderness.  Skin:    General: Skin is warm and dry.  Neurological:     General: No focal deficit present.     Mental Status: She is alert and oriented to person, place, and time.  Psychiatric:        Mood  and Affect: Mood normal.        Behavior: Behavior normal.      Depression Screen PHQ 2/9 Scores 05/01/2020  PHQ - 2 Score 0  PHQ- 9 Score 0   Results for orders placed or performed in visit on 05/01/20  HM PAP SMEAR  Result Value Ref Range   HM Pap smear NEGATIVE FOR INTRAEPITHELIAL LESION OR MALIGNANCY.     Assessment & Plan     1. Pseudotumor cerebri  Managed with diamox by neurology.   2. Constipation, unspecified constipation type  Resolving with fiber. Can  continue her OTC fiber supplement. Suspect abdominal pain is related to some residual issues with constipation but must also consider MSK, ovarian cysts, endometriosis, IBD, PID, and other abdominal pathology. She has recent normal labwork and will get KUB as below.   - DG Abd 1 View; Future  3. Need for tetanus booster  Update today.    No follow-ups on file.     ITrey Sailors, PA-C, have reviewed all documentation for this visit. The documentation on 05/01/20 for the exam, diagnosis, procedures, and orders are all accurate and complete.  The entirety of the information documented in the History of Present Illness, Review of Systems and Physical Exam were personally obtained by me. Portions of this information were initially documented by [facility] and reviewed by me for thoroughness and accuracy.     Maryella Shivers  [facility] [phone] (phone) [phone] (fax)  [facility]

## 2020-05-01 ENCOUNTER — Ambulatory Visit
Admission: RE | Admit: 2020-05-01 | Discharge: 2020-05-01 | Disposition: A | Discharge status: Home / Self Care | Payer: Federal, State, Local not specified - PPO | Source: Ambulatory Visit | Attending: Physician Assistant | Admitting: Physician Assistant

## 2020-05-01 ENCOUNTER — Ambulatory Visit
Admission: RE | Admit: 2020-05-01 | Discharge: 2020-05-01 | Disposition: A | Discharge status: Home / Self Care | Payer: Federal, State, Local not specified - PPO | Attending: Physician Assistant | Admitting: Physician Assistant

## 2020-05-01 ENCOUNTER — Ambulatory Visit (INDEPENDENT_AMBULATORY_CARE_PROVIDER_SITE_OTHER): Payer: Federal, State, Local not specified - PPO | Admitting: Physician Assistant

## 2020-05-01 ENCOUNTER — Other Ambulatory Visit: Payer: Self-pay

## 2020-05-01 ENCOUNTER — Encounter: Payer: Self-pay | Admitting: Physician Assistant

## 2020-05-01 VITALS — BP 120/88 | HR 86 | Temp 98.9°F | Ht 63.0 in | Wt 154.5 lb

## 2020-05-01 DIAGNOSIS — K5900 Constipation, unspecified: Secondary | ICD-10-CM | POA: Insufficient documentation

## 2020-05-01 DIAGNOSIS — R109 Unspecified abdominal pain: Secondary | ICD-10-CM | POA: Diagnosis not present

## 2020-05-01 DIAGNOSIS — R1032 Left lower quadrant pain: Secondary | ICD-10-CM | POA: Diagnosis not present

## 2020-05-01 DIAGNOSIS — Z23 Encounter for immunization: Secondary | ICD-10-CM | POA: Diagnosis not present

## 2020-05-01 DIAGNOSIS — G932 Benign intracranial hypertension: Secondary | ICD-10-CM

## 2020-06-14 ENCOUNTER — Encounter: Payer: Self-pay | Admitting: Physician Assistant

## 2020-06-25 DIAGNOSIS — H539 Unspecified visual disturbance: Secondary | ICD-10-CM | POA: Diagnosis not present

## 2020-06-25 DIAGNOSIS — G932 Benign intracranial hypertension: Secondary | ICD-10-CM | POA: Diagnosis not present

## 2020-06-25 DIAGNOSIS — R519 Headache, unspecified: Secondary | ICD-10-CM | POA: Diagnosis not present

## 2020-08-07 ENCOUNTER — Other Ambulatory Visit: Payer: Self-pay

## 2020-08-07 ENCOUNTER — Encounter: Payer: Self-pay | Admitting: Family Medicine

## 2020-08-07 ENCOUNTER — Ambulatory Visit: Payer: Federal, State, Local not specified - PPO | Admitting: Family Medicine

## 2020-08-07 VITALS — BP 126/86 | HR 51 | Temp 98.5°F | Resp 16 | Wt 152.0 lb

## 2020-08-07 DIAGNOSIS — K5904 Chronic idiopathic constipation: Secondary | ICD-10-CM

## 2020-08-07 NOTE — Progress Notes (Signed)
Established patient visit   Patient: Emma Wood   DOB: Aug 19, 1990   30 y.o. Female  MRN: [id number] Visit Date: 08/07/2020  Today's healthcare provider: Megan Mans, MD   Chief Complaint  Patient presents with  . Constipation   Subjective    Constipation This is a recurrent problem. The current episode started more than 1 month ago. The problem is unchanged. Her stool frequency is 2 to 3 times per week. The stool is described as firm. The patient is not on a high fiber diet. She exercises regularly. There has been adequate water intake. Associated symptoms include bloating. Pertinent negatives include no back pain, difficulty urinating, fever, hematochezia, hemorrhoids, melena, nausea, rectal pain or vomiting. She has tried diet changes, fiber and stool softeners for the symptoms. The treatment provided mild relief.  Patient normally has a bowel movement about every 4 days.  No blood no weight loss no abdominal pain.  Patient was on a multivitamin with iron which made a little bit worse but is improving as she is stopped it.      Medications: Outpatient Medications Prior to Visit  Medication Sig  . acetaminophen (TYLENOL) 325 MG tablet Take 325-650 mg by mouth every 6 (six) hours as needed (for pain or headaches).   Marland Kitchen acetaZOLAMIDE (DIAMOX) 500 MG capsule Take by mouth.  Marland Kitchen ibuprofen (ADVIL,MOTRIN) 200 MG tablet Take 200-400 mg by mouth every 6 (six) hours as needed (for pain or headaches).   Marland Kitchen levocetirizine (XYZAL) 5 MG tablet Take 5 mg by mouth daily.  . Vitamin D, Ergocalciferol, (DRISDOL) 1.25 MG (50000 UT) CAPS capsule Take by mouth.   No facility-administered medications prior to visit.    Review of Systems  Constitutional: Negative for fever.  Gastrointestinal: Positive for bloating and constipation. Negative for hematochezia, hemorrhoids, melena, nausea, rectal pain and vomiting.  Genitourinary: Negative for difficulty urinating.  Musculoskeletal:  Negative for back pain.        Objective    BP 126/86   Pulse (!) 51   Temp 98.5 F (36.9 C)   Resp 16   Wt 152 lb (68.9 kg)   LMP 08/01/2020   BMI 26.93 kg/m  Wt Readings from Last 3 Encounters:  08/07/20 152 lb (68.9 kg)  05/01/20 154 lb 8 oz (70.1 kg)  03/08/19 155 lb 4.8 oz (70.4 kg)       Physical Exam Vitals reviewed.  Constitutional:      Appearance: Normal appearance.  HENT:     Head: Normocephalic and atraumatic.  Eyes:     General: No scleral icterus. Cardiovascular:     Rate and Rhythm: Normal rate and regular rhythm.     Heart sounds: Normal heart sounds.  Pulmonary:     Breath sounds: Normal breath sounds.  Abdominal:     Palpations: Abdomen is soft.  Neurological:     Mental Status: She is alert and oriented to person, place, and time.  Psychiatric:        Mood and Affect: Mood normal.        Behavior: Behavior normal.        Thought Content: Thought content normal.        Judgment: Judgment normal.       No results found for any visits on 08/07/20.  Assessment & Plan     1. Chronic idiopathic constipation Patient has gotten some improvement with both Citrucel and MiraLAX in the past. Discussed trying Citrucel first and if not  better try MiraLAX.  I am comfortable with her using both of these for chronic idiopathic constipation.  No further work-up needed at this time.  GI referral if need be.   No follow-ups on file.      I, Megan Mans, MD, have reviewed all documentation for this visit. The documentation on 08/10/20 for the exam, diagnosis, procedures, and orders are all accurate and complete.    Londen Lorge Wendelyn Breslow, MD  [facility] [phone] (phone) [phone] (fax)  [facility]

## 2020-09-16 ENCOUNTER — Other Ambulatory Visit: Payer: Self-pay

## 2020-09-16 ENCOUNTER — Inpatient Hospital Stay: Payer: Federal, State, Local not specified - PPO | Attending: Oncology

## 2020-09-16 ENCOUNTER — Encounter: Payer: Self-pay | Admitting: Oncology

## 2020-09-16 ENCOUNTER — Inpatient Hospital Stay: Payer: Federal, State, Local not specified - PPO | Admitting: Oncology

## 2020-09-16 VITALS — BP 119/83 | HR 90 | Temp 97.9°F | Resp 16 | Wt 156.6 lb

## 2020-09-16 DIAGNOSIS — D582 Other hemoglobinopathies: Secondary | ICD-10-CM | POA: Diagnosis not present

## 2020-09-16 DIAGNOSIS — R718 Other abnormality of red blood cells: Secondary | ICD-10-CM | POA: Diagnosis not present

## 2020-09-16 DIAGNOSIS — D509 Iron deficiency anemia, unspecified: Secondary | ICD-10-CM

## 2020-09-16 LAB — CBC WITH DIFFERENTIAL/PLATELET
Abs Immature Granulocytes: 0.06 10*3/uL (ref 0.00–0.07)
Basophils Absolute: 0.1 10*3/uL (ref 0.0–0.1)
Basophils Relative: 1 %
Eosinophils Absolute: 0.2 10*3/uL (ref 0.0–0.5)
Eosinophils Relative: 3 %
HCT: 35.6 % — ABNORMAL LOW (ref 36.0–46.0)
Hemoglobin: 12.1 g/dL (ref 12.0–15.0)
Immature Granulocytes: 1 %
Lymphocytes Relative: 20 %
Lymphs Abs: 1.5 10*3/uL (ref 0.7–4.0)
MCH: 24.2 pg — ABNORMAL LOW (ref 26.0–34.0)
MCHC: 34 g/dL (ref 30.0–36.0)
MCV: 71.2 fL — ABNORMAL LOW (ref 80.0–100.0)
Monocytes Absolute: 0.5 10*3/uL (ref 0.1–1.0)
Monocytes Relative: 6 %
Neutro Abs: 5.3 10*3/uL (ref 1.7–7.7)
Neutrophils Relative %: 69 %
Platelets: 239 10*3/uL (ref 150–400)
RBC: 5 MIL/uL (ref 3.87–5.11)
RDW: 15.8 % — ABNORMAL HIGH (ref 11.5–15.5)
WBC: 7.7 10*3/uL (ref 4.0–10.5)
nRBC: 0 % (ref 0.0–0.2)

## 2020-09-16 LAB — COMPREHENSIVE METABOLIC PANEL
ALT: 11 U/L (ref 0–44)
AST: 14 U/L — ABNORMAL LOW (ref 15–41)
Albumin: 3.8 g/dL (ref 3.5–5.0)
Alkaline Phosphatase: 53 U/L (ref 38–126)
Anion gap: 6 (ref 5–15)
BUN: 16 mg/dL (ref 6–20)
CO2: 18 mmol/L — ABNORMAL LOW (ref 22–32)
Calcium: 8.6 mg/dL — ABNORMAL LOW (ref 8.9–10.3)
Chloride: 110 mmol/L (ref 98–111)
Creatinine, Ser: 0.74 mg/dL (ref 0.44–1.00)
GFR, Estimated: >60 mL/min (ref >60)
Glucose, Bld: 96 mg/dL (ref 70–99)
Potassium: 3.7 mmol/L (ref 3.5–5.1)
Sodium: 134 mmol/L — ABNORMAL LOW (ref 135–145)
Total Bilirubin: 1 mg/dL (ref 0.3–1.2)
Total Protein: 6.8 g/dL (ref 6.5–8.1)

## 2020-09-16 NOTE — Progress Notes (Signed)
Hematology/Oncology progress note [facility] [phone] [phone]   Patient Care Team: Maryella Shivers as PCP - General (Physician Assistant) Rickard Patience, MD as Consulting Physician (Oncology)  REFERRING PROVIDER: No ref. provider found  CHIEF COMPLAINTS/REASON FOR VISIT:  Hemoglobin C trait/anemia  HISTORY OF PRESENTING ILLNESS:   Emma Wood is a  30 y.o.  female with PMH listed below was seen in consultation at the request of  No ref. provider found  for evaluation of iron deficiency anemia.  Patient was seen by Dr.Ward recently   02/16/2019 CBC showed hemoglobin 12.5, MCV 74.2, platelet 219,000. Wbc 8.7. Iron panel showed transferrin 209.8, iron 127, iron saturation 43, ferritin 12.   Reviewed her previous blood work.  11/23/2017 showed MCV 71.6, hemoglobin 12.5  She reports her mother also has " smaller blood cells", mild anemia.  Family history is positive for sickle cell trait.  Denies fatigue, fever chills, unintentional weight loss,  Flow of menses, moderate.   # History of pseudotumor cerebri.   INTERVAL HISTORY Emma Wood is a 30 y.o. female who has above history reviewed by me today presents for follow up visit for management of anemia/hemoglobin C trait Problems and complaints are listed below: Patient has no new complaints.  She is currently off oral iron supplementation as advised.  No new complaints Review of Systems  Constitutional:  Negative for appetite change, chills, fatigue and fever.  HENT:   Negative for hearing loss and voice change.   Eyes:  Negative for eye problems.  Respiratory:  Negative for chest tightness and cough.   Cardiovascular:  Negative for chest pain.  Gastrointestinal:  Negative for abdominal distention, abdominal pain and blood in stool.  Endocrine: Negative for hot flashes.  Genitourinary:  Negative for difficulty urinating and frequency.   Musculoskeletal:  Negative for  arthralgias.  Skin:  Negative for itching and rash.  Neurological:  Negative for extremity weakness.  Hematological:  Negative for adenopathy.  Psychiatric/Behavioral:  Negative for confusion.    MEDICAL HISTORY:  Past Medical History:  Diagnosis Date   Asthma    History of Papanicolaou smear of cervix 07/22/2015   neg   IBS (irritable bowel syndrome)    Immunization, viral disease    gardasil completed   Pseudotumor     SURGICAL HISTORY: Past Surgical History:  Procedure Laterality Date   WISDOM TOOTH EXTRACTION      SOCIAL HISTORY: Social History   Socioeconomic History   Marital status: Single    Spouse name: Not on file   Number of children: 0   Years of education: 12   Highest education level: Not on file  Occupational History   Occupation: customer service    Comment: [place]  Tobacco Use   Smoking status: Never   Smokeless tobacco: Never  Vaping Use   Vaping Use: Never used  Substance and Sexual Activity   Alcohol use: No   Drug use: No   Sexual activity: Yes    Birth control/protection: Condom  Other Topics Concern   Not on file  Social History Narrative   Not on file   Social Determinants of Health   Financial Resource Strain: Not on file  Food Insecurity: Not on file  Transportation Needs: Not on file  Physical Activity: Not on file  Stress: Not on file  Social Connections: Not on file  Intimate Partner Violence: Not on file    FAMILY HISTORY: Family History  Problem Relation Age of  Onset   Hypertension Mother    Hypertension Father    Diabetes Maternal Aunt    Diabetes Maternal Grandmother     ALLERGIES:  is allergic to cherry.  MEDICATIONS:  Current Outpatient Medications  Medication Sig Dispense Refill   acetaminophen (TYLENOL) 325 MG tablet Take 325-650 mg by mouth every 6 (six) hours as needed (for pain or headaches).      acetaZOLAMIDE (DIAMOX) 500 MG capsule Take by mouth.     ibuprofen (ADVIL,MOTRIN) 200 MG tablet Take  200-400 mg by mouth every 6 (six) hours as needed (for pain or headaches).      levocetirizine (XYZAL) 5 MG tablet Take 5 mg by mouth daily.     Vitamin D, Ergocalciferol, (DRISDOL) 1.25 MG (50000 UT) CAPS capsule Take by mouth.     No current facility-administered medications for this visit.     PHYSICAL EXAMINATION: ECOG PERFORMANCE STATUS: 0 - Asymptomatic Vitals:   09/16/20 1015  BP: 119/83  Pulse: 90  Resp: 16  Temp: 97.9 F (36.6 C)   Filed Weights   09/16/20 1015  Weight: 156 lb 9.6 oz (71 kg)    Physical Exam Constitutional:      General: She is not in acute distress. HENT:     Head: Normocephalic and atraumatic.  Eyes:     General: No scleral icterus.    Pupils: Pupils are equal, round, and reactive to light.  Cardiovascular:     Rate and Rhythm: Normal rate and regular rhythm.     Heart sounds: Normal heart sounds.  Pulmonary:     Effort: Pulmonary effort is normal. No respiratory distress.     Breath sounds: No wheezing.  Abdominal:     General: Bowel sounds are normal. There is no distension.     Palpations: Abdomen is soft. There is no mass.     Tenderness: There is no abdominal tenderness.  Musculoskeletal:        General: No deformity. Normal range of motion.     Cervical back: Normal range of motion and neck supple.  Skin:    General: Skin is warm and dry.     Findings: No erythema or rash.  Neurological:     Mental Status: She is alert and oriented to person, place, and time.     Cranial Nerves: No cranial nerve deficit.     Coordination: Coordination normal.  Psychiatric:        Behavior: Behavior normal.        Thought Content: Thought content normal.    LABORATORY DATA:  I have reviewed the data as listed Lab Results  Component Value Date   WBC 7.7 09/16/2020   HGB 12.1 09/16/2020   HCT 35.6 (L) 09/16/2020   MCV 71.2 (L) 09/16/2020   PLT 239 09/16/2020   Recent Labs    09/16/20 0951  NA 134*  K 3.7  CL 110  CO2 18*  GLUCOSE  96  BUN 16  CREATININE 0.74  CALCIUM 8.6*  GFRNONAA >60  PROT 6.8  ALBUMIN 3.8  AST 14*  ALT 11  ALKPHOS 53  BILITOT 1.0   Iron/TIBC/Ferritin/ %Sat    Component Value Date/Time   IRON 103 09/18/2019 1324   TIBC 269 09/18/2019 1324   FERRITIN 14 09/18/2019 1324   IRONPCTSAT 38 (H) 09/18/2019 1324      RADIOGRAPHIC STUDIES: I have personally reviewed the radiological images as listed and agreed with the findings in the report. No results found.  ASSESSMENT & PLAN:  1. Hemoglobin C trait (HCC)   2. Microcytosis    Labs reviewed and discussed with patient Hemoglobin has normalized to her baseline at 12.1. Iron panel is normal. No need for oral iron supplementation and advised patient not to start empiric iron supplementation due to microcytosis.  Hemoglobin C trait Recommend prenatal counseling.  She will be discharged from hematology clinic.  Continue follow-up with PCP with annual CBC testing.  She agrees with the plan.   All questions were answered. The patient knows to call the clinic with any problems questions or concerns.  Rickard Patience, MD, PhD Hematology Oncology [facility] at [facility] Pager- [phone] 09/16/2020

## 2020-09-16 NOTE — Progress Notes (Signed)
Patient denies new problems/concerns today.   °

## 2020-10-16 DIAGNOSIS — G932 Benign intracranial hypertension: Secondary | ICD-10-CM | POA: Diagnosis not present

## 2020-10-30 DIAGNOSIS — R519 Headache, unspecified: Secondary | ICD-10-CM | POA: Diagnosis not present

## 2020-10-30 DIAGNOSIS — H539 Unspecified visual disturbance: Secondary | ICD-10-CM | POA: Diagnosis not present

## 2020-10-30 DIAGNOSIS — G932 Benign intracranial hypertension: Secondary | ICD-10-CM | POA: Diagnosis not present

## 2021-02-19 DIAGNOSIS — G932 Benign intracranial hypertension: Secondary | ICD-10-CM | POA: Diagnosis not present

## 2021-03-05 DIAGNOSIS — R519 Headache, unspecified: Secondary | ICD-10-CM | POA: Diagnosis not present

## 2021-03-05 DIAGNOSIS — H539 Unspecified visual disturbance: Secondary | ICD-10-CM | POA: Diagnosis not present

## 2021-03-05 DIAGNOSIS — G932 Benign intracranial hypertension: Secondary | ICD-10-CM | POA: Diagnosis not present

## 2021-04-09 IMAGING — CR DG ABDOMEN 1V
1 series · 1 of 1 positions shown · non-contrast
Comparison: None.

CLINICAL DATA: Chronic left lower quadrant abdominal pain.
Constipation.

EXAM:
ABDOMEN - 1 VIEW

[dg abd 1 view]
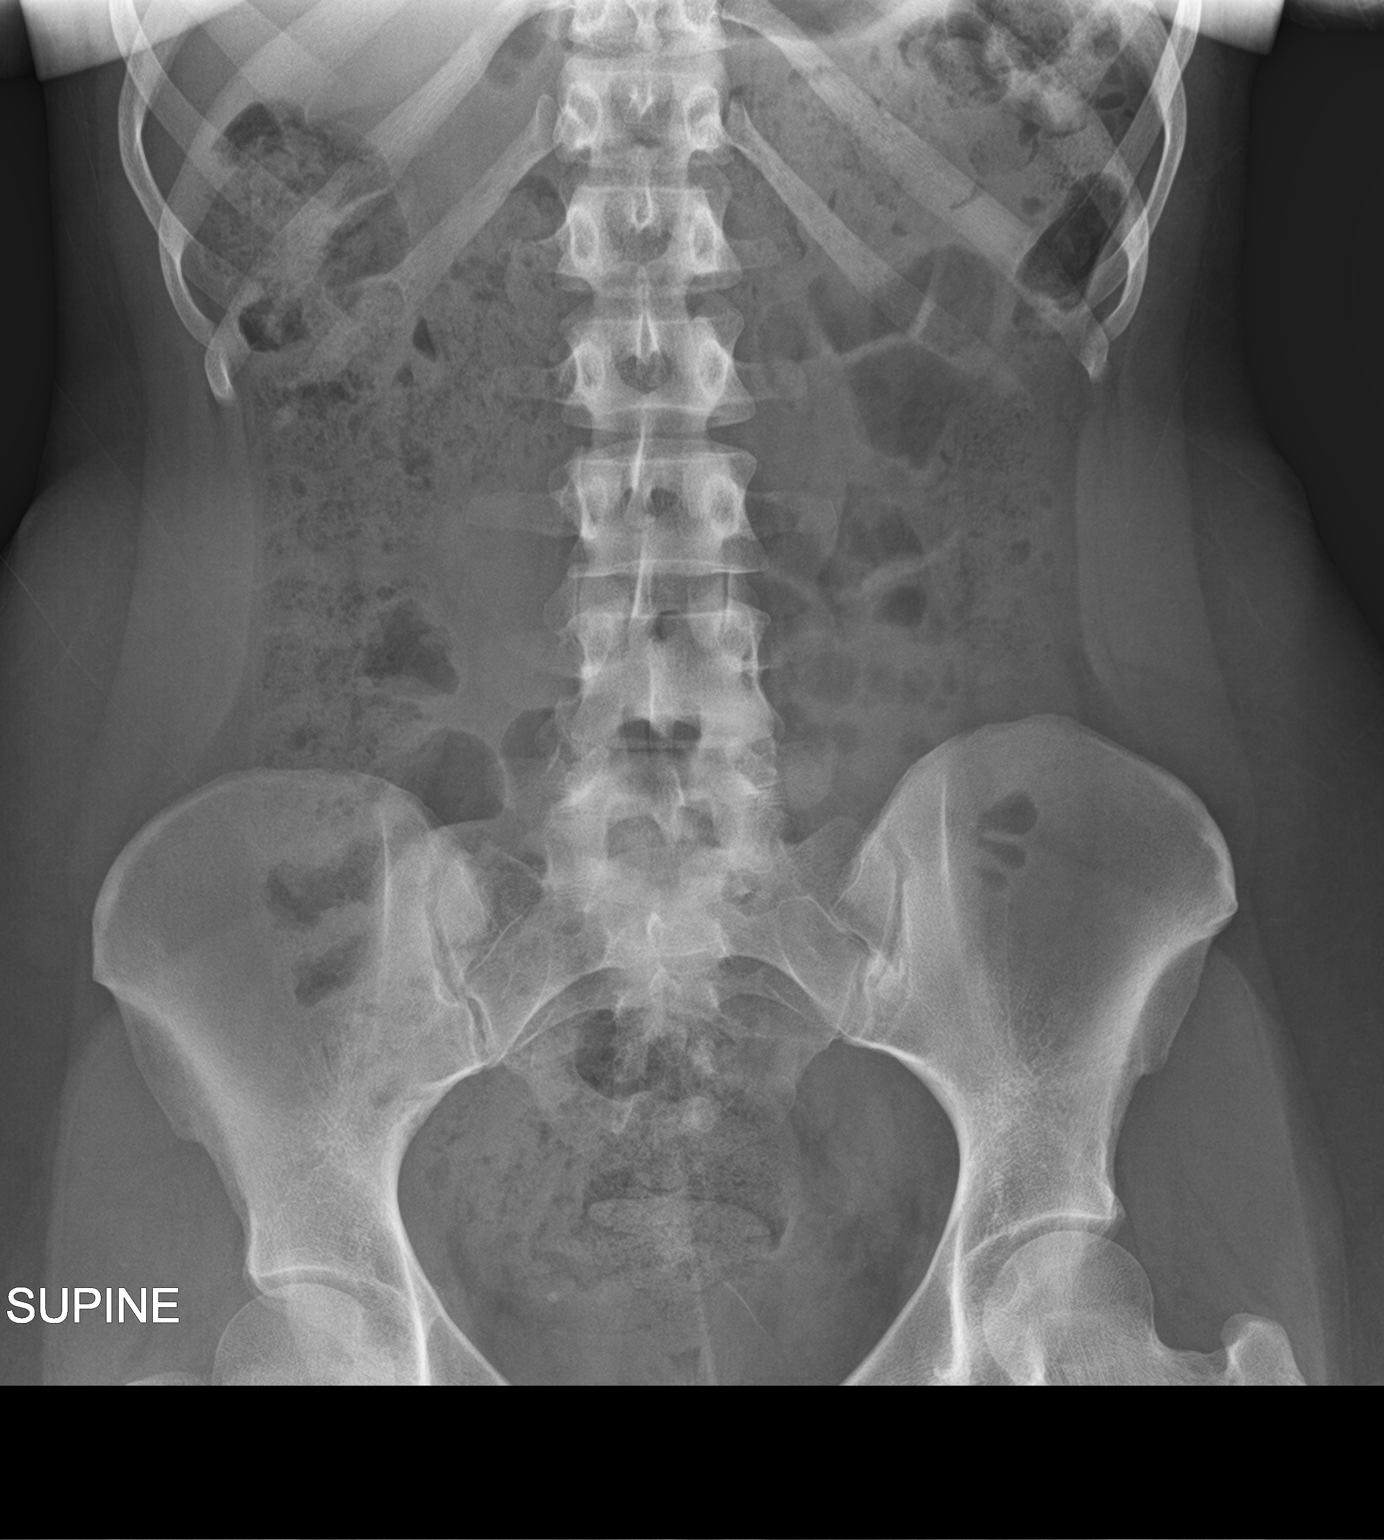

[1 of 1 positions shown; findings below may reference images not displayed]

FINDINGS: No abnormal bowel dilatation is noted. Large amount of stool is seen
throughout the colon. No radio-opaque calculi or other significant
radiographic abnormality are seen.
IMPRESSION: Large stool burden.  No evidence of bowel obstruction or ileus.

## 2021-06-18 DIAGNOSIS — Z01419 Encounter for gynecological examination (general) (routine) without abnormal findings: Secondary | ICD-10-CM | POA: Diagnosis not present

## 2021-06-18 DIAGNOSIS — Z124 Encounter for screening for malignant neoplasm of cervix: Secondary | ICD-10-CM | POA: Diagnosis not present

## 2021-06-18 DIAGNOSIS — Z113 Encounter for screening for infections with a predominantly sexual mode of transmission: Secondary | ICD-10-CM | POA: Diagnosis not present
